# Patient Record
Sex: Male | Born: 1995
Health system: Southern US, Community
[De-identification: ages and names within clinical notes are randomized; demographics above are authoritative.]

---
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
## 2019-04-21 ENCOUNTER — Ambulatory Visit: Payer: 59 | Attending: Internal Medicine

## 2019-04-21 ENCOUNTER — Other Ambulatory Visit: Payer: Self-pay

## 2019-04-21 DIAGNOSIS — Z20828 Contact with and (suspected) exposure to other viral communicable diseases: Secondary | ICD-10-CM | POA: Diagnosis not present

## 2019-04-21 DIAGNOSIS — Z20822 Contact with and (suspected) exposure to covid-19: Secondary | ICD-10-CM

## 2019-04-22 LAB — NOVEL CORONAVIRUS, NAA: SARS-CoV-2, NAA: NOT DETECTED

## 2019-08-15 ENCOUNTER — Other Ambulatory Visit: Payer: Self-pay

## 2019-08-15 ENCOUNTER — Ambulatory Visit: Payer: 59 | Attending: Internal Medicine

## 2019-08-15 DIAGNOSIS — Z23 Encounter for immunization: Secondary | ICD-10-CM

## 2019-08-15 NOTE — Progress Notes (Signed)
   [facility] Vaccination [facility]  Name:  Craig Davidson    MRN: [id number] DOB: [birth year]  08/15/2019  [name] was observed post Covid-19 immunization for 15 minutes without incident. He was provided with Vaccine Information Sheet and instruction to access the V-Safe system.   [name] was instructed to call 911 with any severe reactions post vaccine: Marland Kitchen Difficulty breathing  . Swelling of face and throat  . A fast heartbeat  . A bad rash all over body  . Dizziness and weakness   Immunizations Administered    Name Date Dose VIS Date Route   Pfizer COVID-19 Vaccine 08/15/2019 10:26 AM 0.3 mL 04/15/2019 Intramuscular   Manufacturer: [employer], [employer]   Lot: [id number]   NDC: [address]-6

## 2019-08-18 ENCOUNTER — Ambulatory Visit: Payer: 59

## 2019-09-07 ENCOUNTER — Ambulatory Visit: Payer: Self-pay

## 2019-09-09 ENCOUNTER — Ambulatory Visit: Payer: 59 | Attending: Internal Medicine

## 2019-09-09 DIAGNOSIS — Z23 Encounter for immunization: Secondary | ICD-10-CM

## 2019-09-09 NOTE — Progress Notes (Signed)
   Covid-19 Vaccination Clinic  Name:  Tyvion Edmondson    MRN: [id number] DOB: 07-07-1995  09/09/2019  Mr. Nisley was observed post Covid-19 immunization for 15 minutes without incident. He was provided with Vaccine Information Sheet and instruction to access the V-Safe system.   Mr. Riegler was instructed to call 911 with any severe reactions post vaccine: Marland Kitchen Difficulty breathing  . Swelling of face and throat  . A fast heartbeat  . A bad rash all over body  . Dizziness and weakness   Immunizations Administered    Name Date Dose VIS Date Route   Pfizer COVID-19 Vaccine 09/09/2019 11:48 AM 0.3 mL 06/29/2018 Intramuscular   Manufacturer: [employer], [employer]   Lot: [id number]   NDC: [address]-9

## 2020-12-27 ENCOUNTER — Ambulatory Visit (HOSPITAL_BASED_OUTPATIENT_CLINIC_OR_DEPARTMENT_OTHER): Payer: BC Managed Care – PPO | Admitting: Family Medicine
